# Patient Record
Sex: Female | Born: 1955 | Race: White | Hispanic: No
Health system: Southern US, Community
[De-identification: ages and names within clinical notes are randomized; demographics above are authoritative.]

---

## 2013-10-31 ENCOUNTER — Other Ambulatory Visit: Payer: Self-pay | Admitting: Orthopedic Surgery

## 2013-10-31 DIAGNOSIS — M25519 Pain in unspecified shoulder: Secondary | ICD-10-CM

## 2013-11-10 ENCOUNTER — Other Ambulatory Visit: Payer: Self-pay

## 2013-11-11 ENCOUNTER — Ambulatory Visit
Admission: RE | Admit: 2013-11-11 | Discharge: 2013-11-11 | Disposition: A | Discharge status: Home / Self Care | Payer: Self-pay | Source: Ambulatory Visit | Attending: Orthopedic Surgery | Admitting: Orthopedic Surgery

## 2013-11-11 DIAGNOSIS — M25519 Pain in unspecified shoulder: Secondary | ICD-10-CM

## 2013-11-12 ENCOUNTER — Other Ambulatory Visit: Payer: Self-pay

## 2013-11-13 ENCOUNTER — Other Ambulatory Visit: Payer: Self-pay

## 2017-02-17 ENCOUNTER — Other Ambulatory Visit: Payer: Self-pay | Admitting: Orthopedic Surgery

## 2017-02-17 DIAGNOSIS — M25511 Pain in right shoulder: Secondary | ICD-10-CM

## 2017-03-06 ENCOUNTER — Ambulatory Visit
Admission: RE | Admit: 2017-03-06 | Discharge: 2017-03-06 | Disposition: A | Discharge status: Home / Self Care | Payer: Self-pay | Source: Ambulatory Visit | Attending: Orthopedic Surgery | Admitting: Orthopedic Surgery

## 2017-03-06 DIAGNOSIS — M25511 Pain in right shoulder: Secondary | ICD-10-CM

## 2018-09-03 IMAGING — MR MR SHOULDER*R* W/O CM
4 of 5 series · 27 of 40 positions shown · non-contrast
Comparison: 11/11/2013

CLINICAL DATA: Burning and aching sensation in the right shoulder
and arm starting a few months ago. Prior rotator cuff surgery.

EXAM:
MRI OF THE RIGHT SHOULDER WITHOUT CONTRAST
TECHNIQUE: Multiplanar, multisequence MR imaging of the shoulder was performed.
No intravenous contrast was administered.

[Series 3: T2 fat-sat · axial · 3.5mm · 0.55mm/px · z∈[-14,+59]mm · 8 of 20 slices shown (1 of 3)]
[im 1/20]
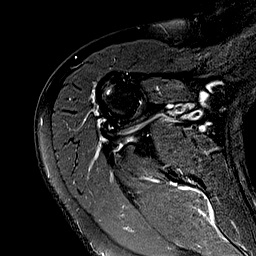
[im 3/20]
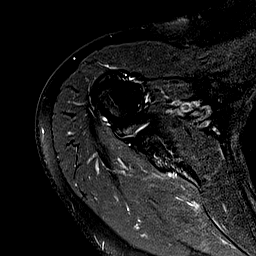
[im 6/20]
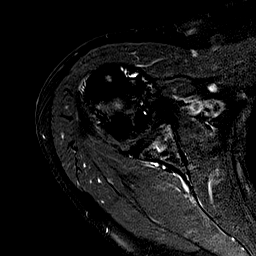
[im 9/20]
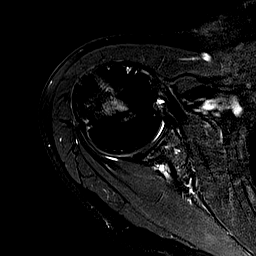
[im 11/20]
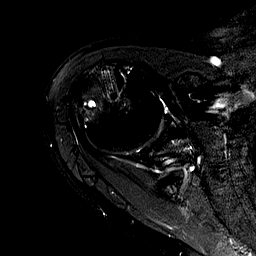
[im 14/20]
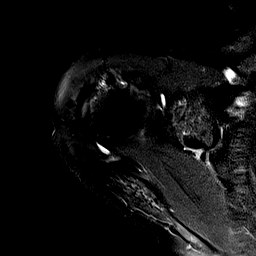
[im 17/20]
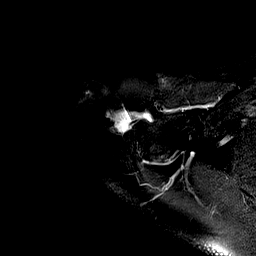
[im 20/20]
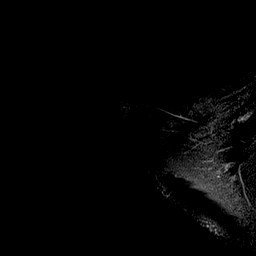

[Series 4: T2 fat-sat · oblique · 4.0mm · 0.55mm/px · 9 of 20 slices shown (2 of 3)]
[im 1/20]
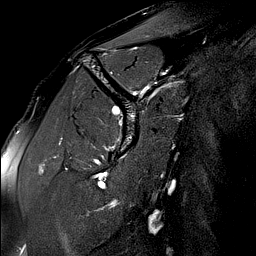
[im 3/20]
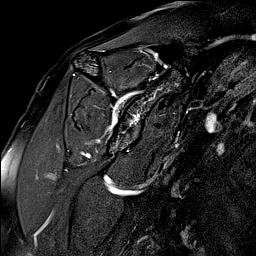
[im 5/20]
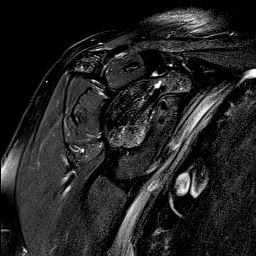
[im 8/20]
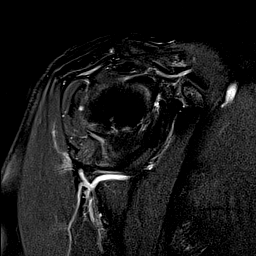
[im 10/20]
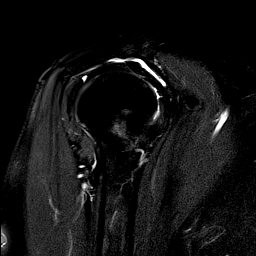
[im 12/20]
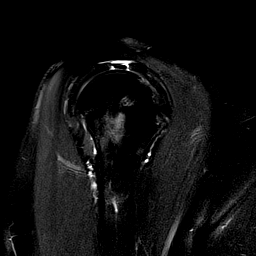
[im 15/20]
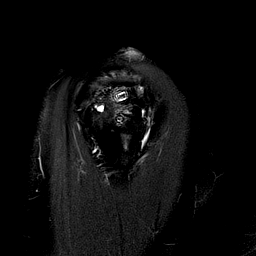
[im 17/20]
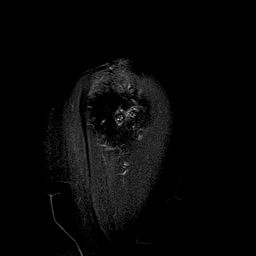
[im 20/20]
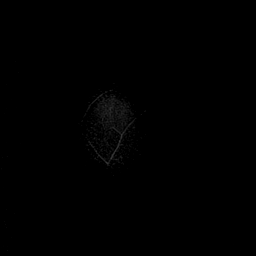

[Series 6: T2 fat-sat · oblique · 4.0mm · 0.27mm/px · 3 of 17 slices shown (3 of 3)]
[im 3/17]
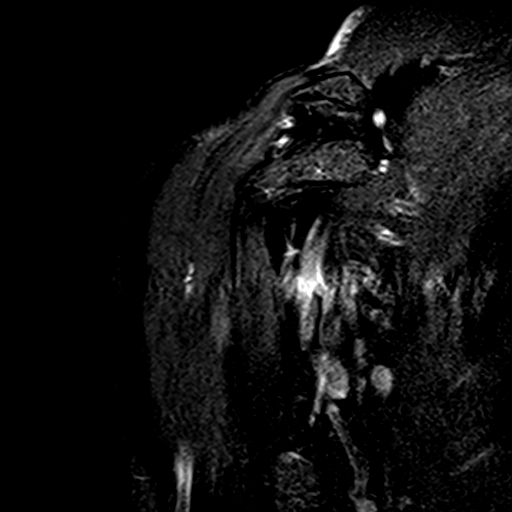
[im 9/17]
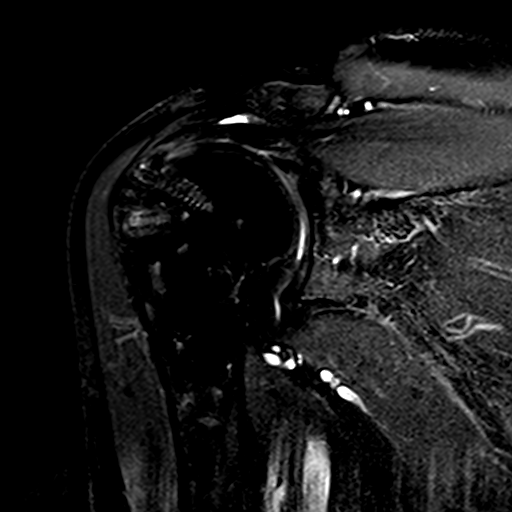
[im 14/17]
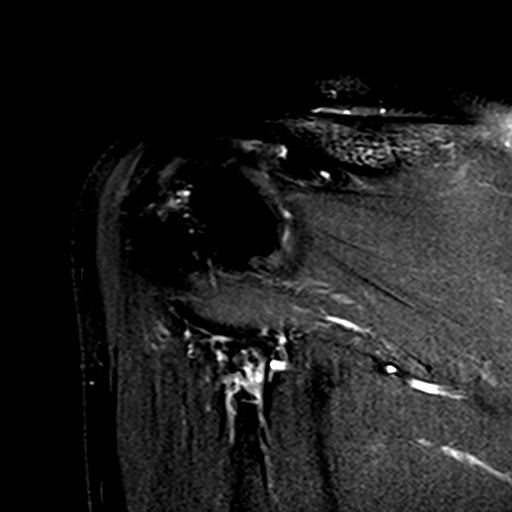

[Series 7: PD · oblique · 4.0mm · 0.22mm/px · 7 of 17 slices shown]
[im 1/17]
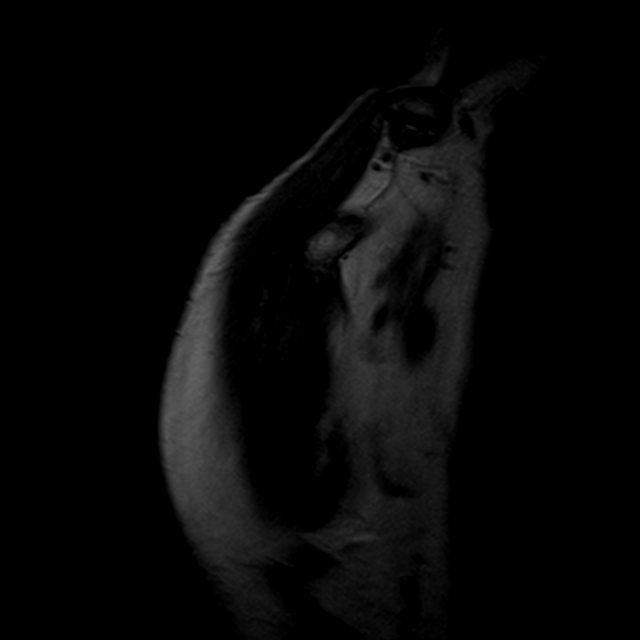
[im 3/17]
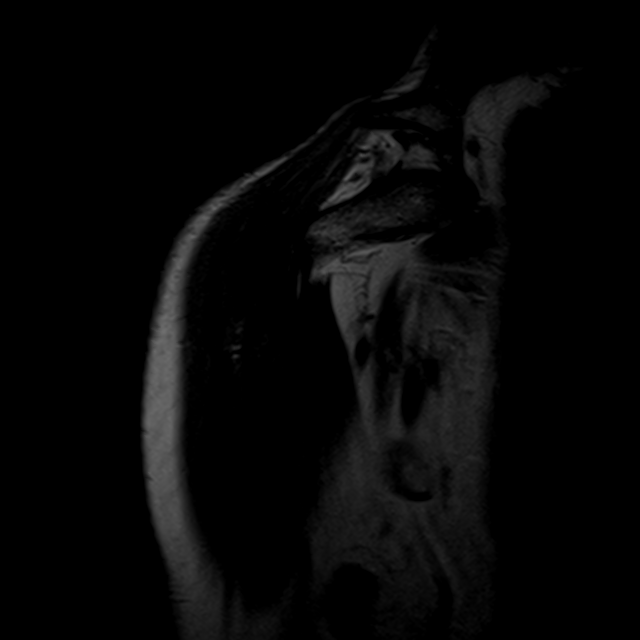
[im 6/17]
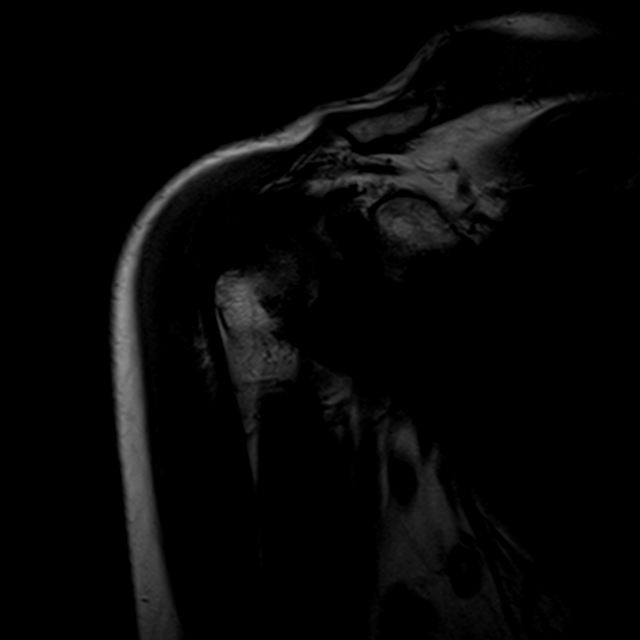
[im 9/17]
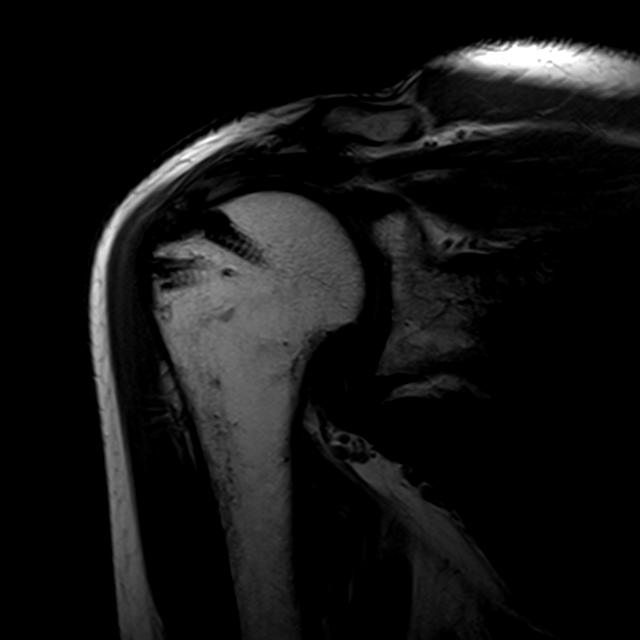
[im 11/17]
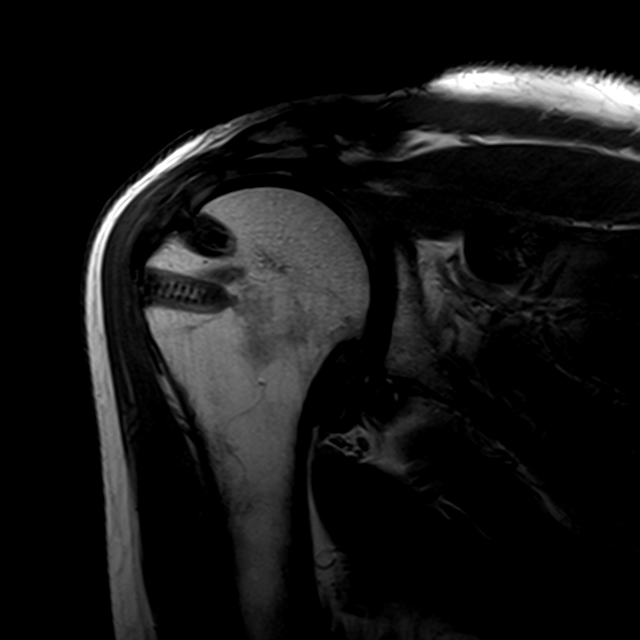
[im 14/17]
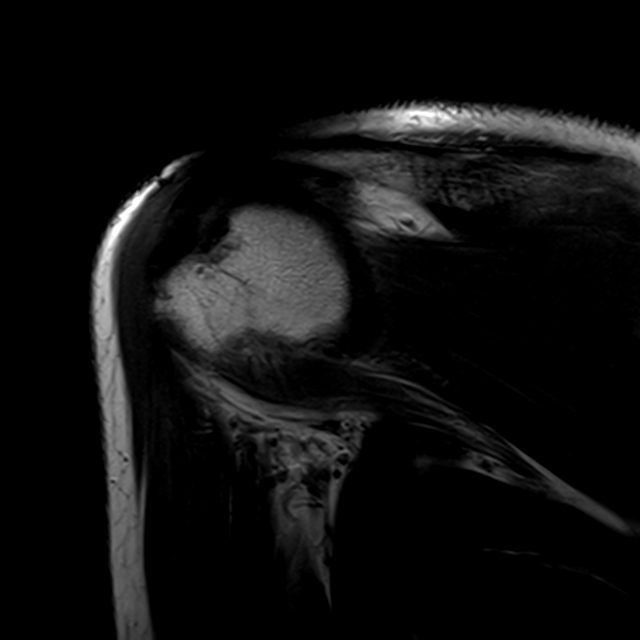
[im 17/17]
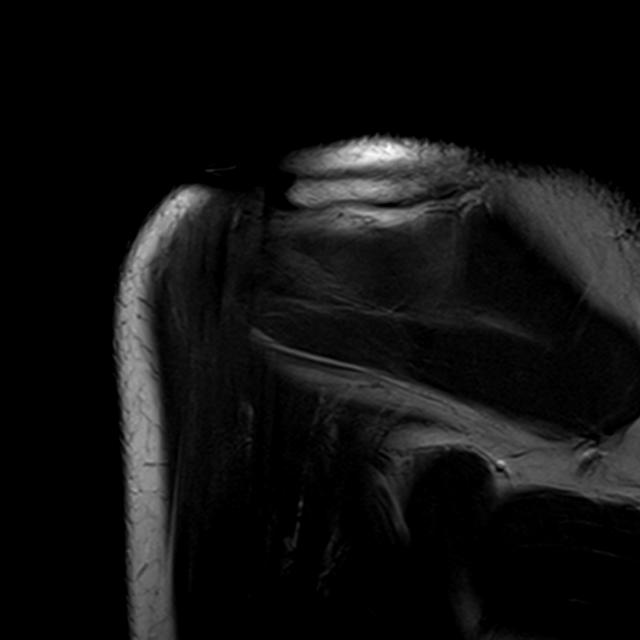

[27 of 40 positions shown; findings below may reference images not displayed]

FINDINGS: Rotator cuff: 2 fissure like regions of full-thickness partial width
tearing of the supraspinatus tendon are shown on images 11 through
14 of series 4 extending longitudinally. There is also partial
thickness articular surface tearing with tendon delamination shown
on image [DATE]. 3 distal rotator cuff screws are noted. Moderate
supraspinatus with mild infraspinatus and subscapularis
tendinopathy.

Muscles:  Teres minor fatty atrophy, image [DATE], worsened from prior.

Biceps long head:  Mild tendinopathy.

Acromioclavicular Joint: Suspected prior acromioplasty and/ or
distal clavicular resection. No significant residual arthropathy.
Type I acromion. Small amount of fluid in the subacromial subdeltoid
bursa.

Glenohumeral Joint: Moderate degenerative chondral thinning. Mild
spurring of the humeral head. Mild spurring of the inferior glenoid.

Labrum:  Grossly unremarkable

Bones:  Rotator cuff screws in place.

Other: No supplemental non-categorized findings.
IMPRESSION: 1. Full-thickness partial width tearing of the supraspinatus tendon,
with 2 fissure like tears extending longitudinally in the tendon.
There is also partial thickness articular surface tearing with
tendon delamination the supraspinatus. Moderate supraspinatus and
mild infraspinatus and subscapularis tendinopathy.
2. Teres minor fatty atrophy compatible with quadrilateral space
syndrome.
3. Mild biceps tendinopathy.
4. Moderate degenerative chondral thinning and mild spurring in the
glenohumeral joint.
5. Rotator cuff screw anchors in place.

## 2019-01-04 ENCOUNTER — Other Ambulatory Visit: Payer: Self-pay | Admitting: Orthopedic Surgery

## 2019-01-16 ENCOUNTER — Encounter (HOSPITAL_BASED_OUTPATIENT_CLINIC_OR_DEPARTMENT_OTHER): Payer: Self-pay

## 2019-01-16 ENCOUNTER — Ambulatory Visit (HOSPITAL_BASED_OUTPATIENT_CLINIC_OR_DEPARTMENT_OTHER): Admit: 2019-01-16 | Payer: Self-pay | Admitting: Orthopedic Surgery

## 2019-01-16 SURGERY — ARTHROPLASTY, FINGER
Anesthesia: General | Laterality: Right

## 2022-10-22 ENCOUNTER — Other Ambulatory Visit: Payer: Self-pay | Admitting: Orthopedic Surgery

## 2022-10-22 DIAGNOSIS — M25511 Pain in right shoulder: Secondary | ICD-10-CM

## 2022-11-24 ENCOUNTER — Ambulatory Visit
Admission: RE | Admit: 2022-11-24 | Discharge: 2022-11-24 | Disposition: A | Discharge status: Home / Self Care | Payer: Self-pay | Source: Ambulatory Visit | Attending: Orthopedic Surgery | Admitting: Orthopedic Surgery

## 2022-11-24 DIAGNOSIS — M25511 Pain in right shoulder: Secondary | ICD-10-CM

## 2022-12-13 ENCOUNTER — Ambulatory Visit
Admission: RE | Admit: 2022-12-13 | Discharge: 2022-12-13 | Disposition: A | Discharge status: Home / Self Care | Payer: Medicare HMO | Source: Ambulatory Visit | Attending: Orthopedic Surgery | Admitting: Orthopedic Surgery

## 2022-12-13 DIAGNOSIS — M25511 Pain in right shoulder: Secondary | ICD-10-CM

## 2022-12-17 ENCOUNTER — Other Ambulatory Visit: Payer: Self-pay

## 2023-04-07 ENCOUNTER — Other Ambulatory Visit: Payer: Self-pay | Admitting: Internal Medicine
